# Patient Record
Sex: Female | Born: 1950 | Race: White | Hispanic: No | Marital: Married | Smoking: Current every day smoker
Health system: Southern US, Community
[De-identification: ages and names within clinical notes are randomized; demographics above are authoritative.]

## PROBLEM LIST (undated history)

## (undated) DIAGNOSIS — F419 Anxiety disorder, unspecified: Secondary | ICD-10-CM

## (undated) DIAGNOSIS — Z9011 Acquired absence of right breast and nipple: Secondary | ICD-10-CM

## (undated) DIAGNOSIS — C50919 Malignant neoplasm of unspecified site of unspecified female breast: Secondary | ICD-10-CM

## (undated) HISTORY — PX: BREAST LUMPECTOMY: SHX2

## (undated) HISTORY — PX: MASTECTOMY: SHX3

---

## 2007-08-28 ENCOUNTER — Emergency Department: Payer: Self-pay | Admitting: Emergency Medicine

## 2008-05-10 ENCOUNTER — Ambulatory Visit: Payer: Self-pay | Admitting: Gastroenterology

## 2008-07-14 IMAGING — CR DG CHEST 2V
1 series · 2 of 2 positions shown · non-contrast
Comparison: none

REASON FOR EXAM: cough; R ribs sore
COMMENTS:

[Series 1: view not recorded · 0.17mm/px · 2 of 2 slices shown]
[im 1/2]
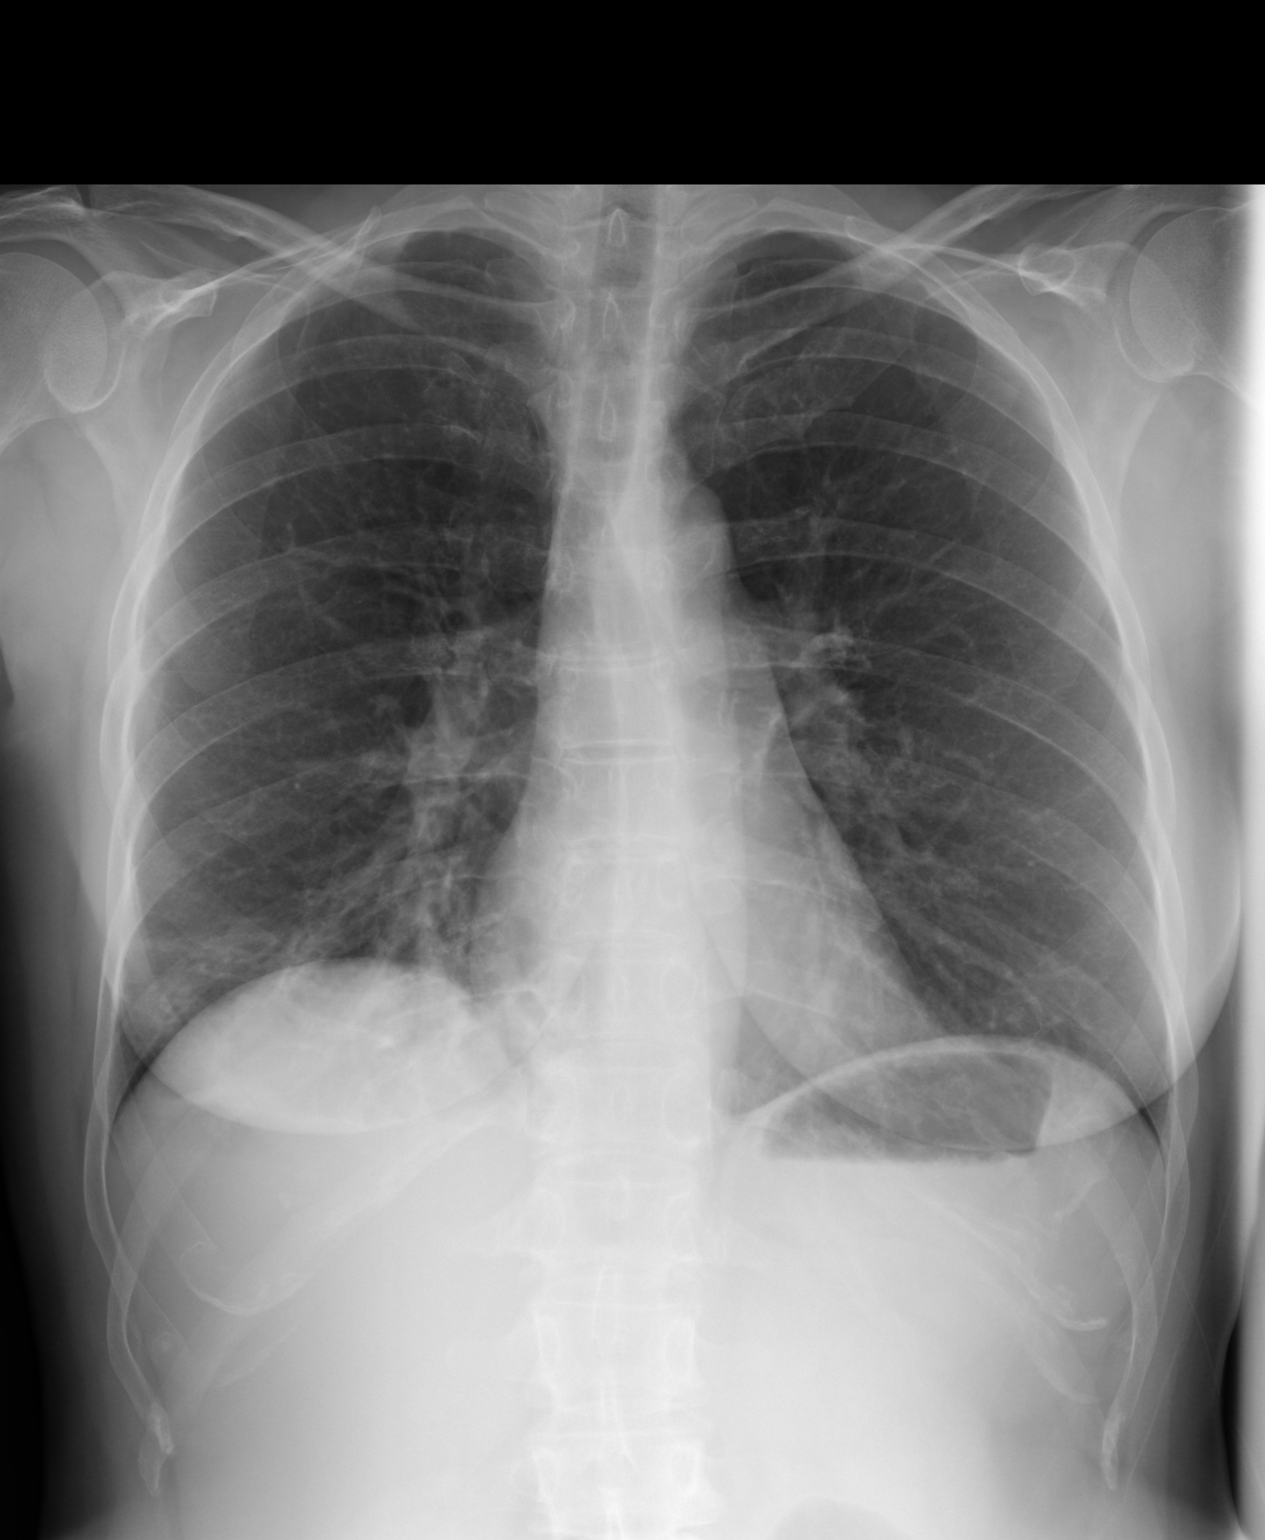
[im 2/2]
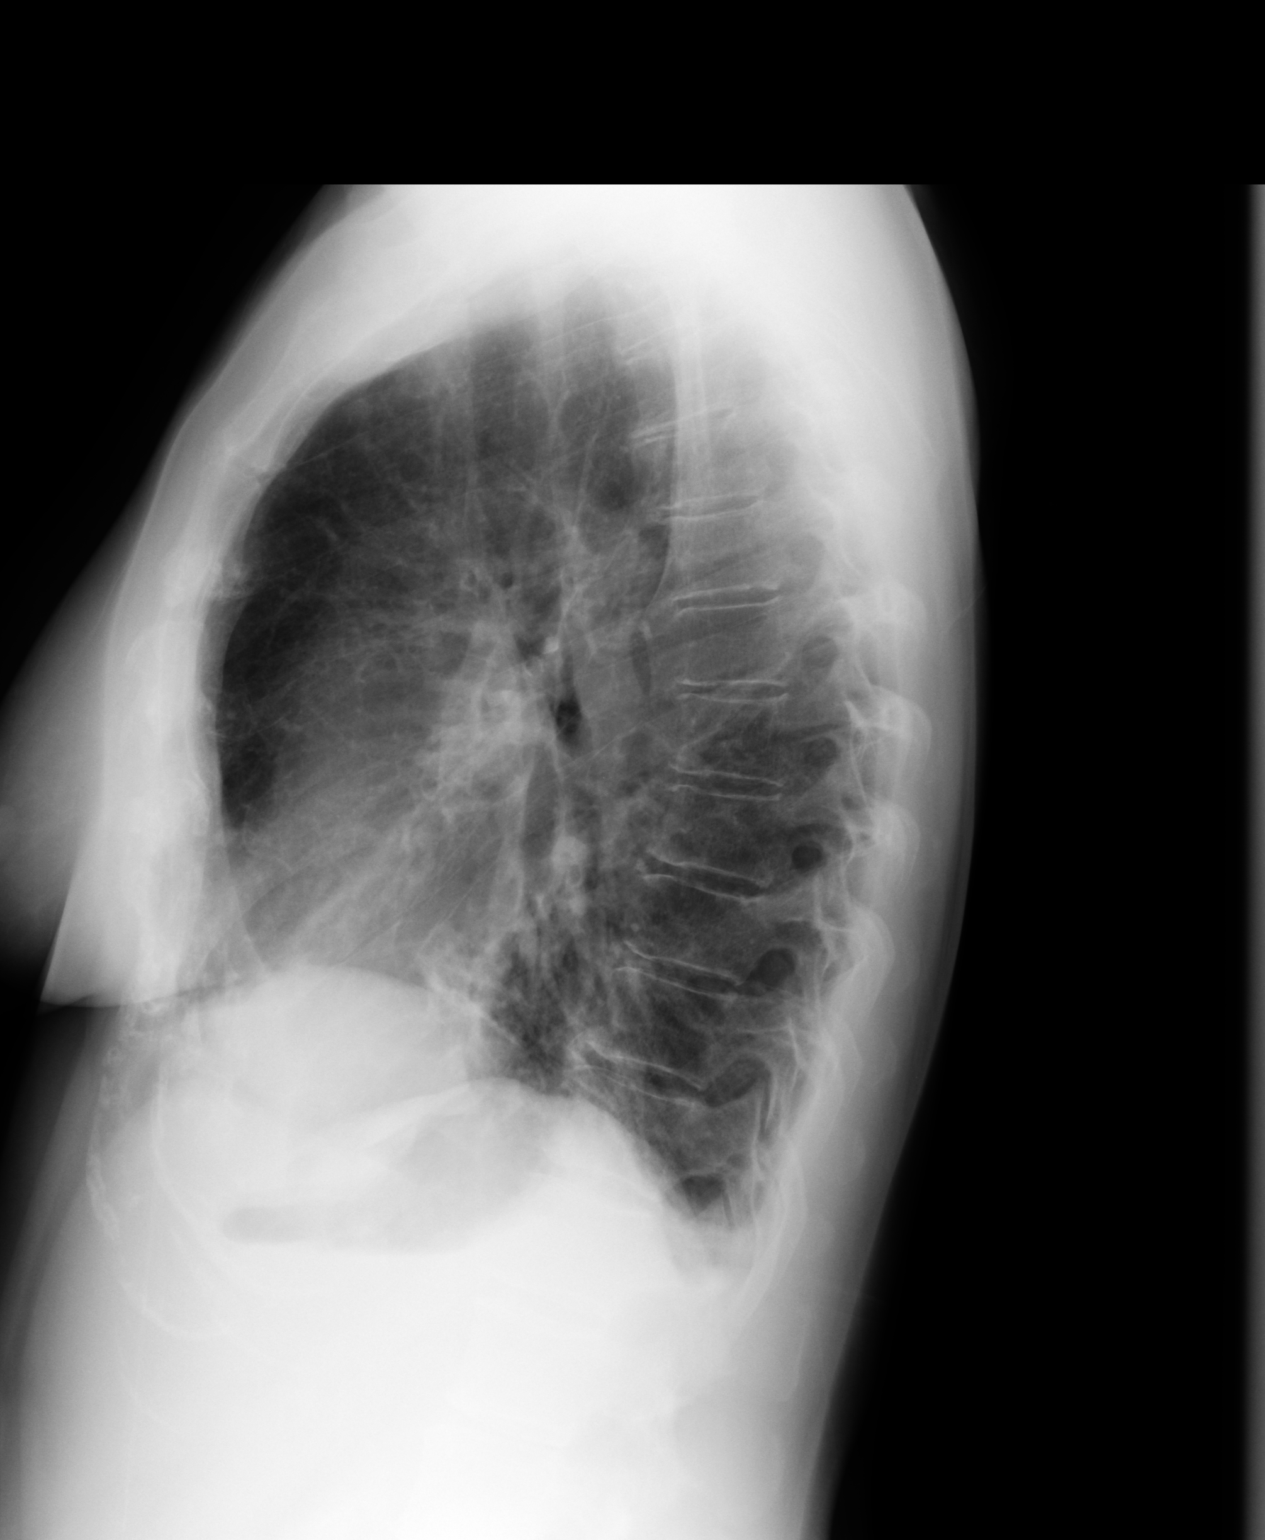

[2 of 2 positions shown; findings below may reference images not displayed]

PROCEDURE:     DXR - DXR CHEST PA (OR AP) AND LATERAL  - August 28, 2007  [DATE]

RESULT:     The lungs are adequately inflated. There is increased density in
the RIGHT lower lobe anterolaterally. There is a trace of blunting of the
RIGHT posterior costophrenic gutter. The LEFT lung is clear. The heart and
pulmonary vascularity are within the limits of normal.
IMPRESSION: There is an infiltrate in the RIGHT lower lobe consistent
with pneumonia. Follow-up films following therapy would be of value to
assure complete clearing.

## 2013-06-09 ENCOUNTER — Ambulatory Visit: Payer: Self-pay | Admitting: Gastroenterology

## 2014-10-03 ENCOUNTER — Ambulatory Visit: Payer: Self-pay | Admitting: Family Medicine

## 2017-08-24 ENCOUNTER — Other Ambulatory Visit: Payer: Self-pay

## 2017-08-24 ENCOUNTER — Ambulatory Visit
Admission: EM | Admit: 2017-08-24 | Discharge: 2017-08-24 | Disposition: A | Discharge status: Home / Self Care | Payer: Medicare Other | Attending: Family Medicine | Admitting: Family Medicine

## 2017-08-24 DIAGNOSIS — F419 Anxiety disorder, unspecified: Secondary | ICD-10-CM | POA: Diagnosis not present

## 2017-08-24 HISTORY — DX: Acquired absence of right breast and nipple: Z90.11

## 2017-08-24 HISTORY — DX: Malignant neoplasm of unspecified site of unspecified female breast: C50.919

## 2017-08-24 HISTORY — DX: Anxiety disorder, unspecified: F41.9

## 2017-08-24 MED ORDER — LORAZEPAM 0.5 MG PO TABS: ORAL_TABLET | ORAL | 0 refills | Status: AC

## 2017-08-24 MED ORDER — DULOXETINE HCL 30 MG PO CPEP: 30.0000 mg | ORAL_CAPSULE | Freq: Every day | ORAL | 0 refills | Status: AC

## 2017-08-24 NOTE — ED Triage Notes (Addendum)
Pt with long history of anxiety and feels like it has been worse the past 5 days. Feels like she has had palpitations and starts to sweat worse in the mornings. States she has been trying to get ahold of her therapist and has not gotten a call back.

## 2017-08-24 NOTE — ED Provider Notes (Signed)
[facility]    CSN: [id number] Arrival date & time: 08/24/17  0847     History   Chief Complaint Chief Complaint  Patient presents with  . Anxiety    HPI Pamela Acevedo is a 67 y.o. female.   67 yo female with a h/o chronic anxiety presents with a c/o worsening anxiety over the last 5 days. States her duloxetine was recently increased from 30mg  to 60mg  and patient unsure if having side effects of increased anxiety, insomnia. States she took 30mg  without any problems. Also states has had increased stressors due to her husband's illnesses. Patient denies any suicidal or homicidal ideation.    The history is provided by the patient.  Anxiety     Past Medical History:  Diagnosis Date  . Anxiety   . Breast cancer ([place])   . History of right mastectomy     There are no active problems to display for this patient.   Past Surgical History:  Procedure Laterality Date  . BREAST LUMPECTOMY    . MASTECTOMY      OB History    No data available       Home Medications    Prior to Admission medications   Medication Sig Start Date End Date Taking? Authorizing Provider  celecoxib (CELEBREX) 200 MG capsule Take 200 mg by mouth 2 (two) times daily.   Yes [provider]  Desvenlafaxine ER (PRISTIQ) 50 MG TB24 Take by mouth daily.   Yes [provider]  letrozole (FEMARA) 2.5 MG tablet Take 2.5 mg by mouth daily.   Yes [provider]  traZODone (DESYREL) 50 MG tablet Take 50 mg by mouth at bedtime.   Yes [provider]  DULoxetine (CYMBALTA) 30 MG capsule Take 1 capsule (30 mg total) by mouth daily. 08/24/17   Norval Gable, MD  LORazepam (ATIVAN) 0.5 MG tablet 1-2 tabs po qhs prn 08/24/17   Norval Gable, MD    Family History Family History  Problem Relation Age of Onset  . Depression Mother     Social History Social History   Tobacco Use  . Smoking status: Current Every Day Smoker    Packs/day: 0.25    Types:  Cigarettes  . Smokeless tobacco: Never Used  Substance Use Topics  . Alcohol use: No    Frequency: Never  . Drug use: No     Allergies   Codeine   Review of Systems Review of Systems   Physical Exam Triage Vital Signs ED Triage Vitals  Enc Vitals Group     BP 08/24/17 0912 110/82     Pulse Rate 08/24/17 0912 87     Resp 08/24/17 0912 16     Temp 08/24/17 0912 98.2 F (36.8 C)     Temp Source 08/24/17 0912 Oral     SpO2 08/24/17 0912 99 %     Weight 08/24/17 0907 140 lb (63.5 kg)     Height 08/24/17 0907 5\' 4"  (1.626 m)     Head Circumference --      Peak Flow --      Pain Score 08/24/17 0940 1     Pain Loc --      Pain Edu? --      Excl. in [place]? --    No data found.  Updated Vital Signs BP 110/82 (BP Location: Left Arm)   Pulse 87   Temp 98.2 F (36.8 C) (Oral)   Resp 16   Ht 5\' 4"  (1.626  m)   Wt 140 lb (63.5 kg)   SpO2 99%   BMI 24.03 kg/m   Visual Acuity Right Eye Distance:   Left Eye Distance:   Bilateral Distance:    Right Eye Near:   Left Eye Near:    Bilateral Near:     Physical Exam  Constitutional: She appears well-developed and well-nourished. No distress.  Cardiovascular: Normal rate, regular rhythm and normal heart sounds.  Pulmonary/Chest: Effort normal and breath sounds normal. No stridor. No respiratory distress. She has no wheezes.  Skin: She is not diaphoretic.  Psychiatric: Her speech is normal and behavior is normal. Judgment and thought content normal. Her mood appears anxious. She is not actively hallucinating. Cognition and memory are normal. She exhibits a depressed mood. She is attentive.  Nursing note and vitals reviewed.    UC Treatments / Results  Labs (all labs ordered are listed, but only abnormal results are displayed) Labs Reviewed - No data to display  EKG  EKG Interpretation None       Radiology No results found.  Procedures Procedures (including critical care time)  Medications Ordered in  UC Medications - No data to display   Initial Impression / Assessment and Plan / UC Course  I have reviewed the triage vital signs and the nursing notes.  Pertinent labs & imaging results that were available during my care of the patient were reviewed by me and considered in my medical decision making (see chart for details).       Final Clinical Impressions(s) / UC Diagnoses   Final diagnoses:  Anxiety    ED Discharge Orders        Ordered    DULoxetine (CYMBALTA) 30 MG capsule  Daily     08/24/17 0936    LORazepam (ATIVAN) 0.5 MG tablet     08/24/17 0936     1. diagnosis reviewed with patient 2. rx as per orders above; reviewed possible side effects, interactions, risks and benefits; decrease duloxetine to 30mg  for a few days, while patient contacts her mental health providers  3. Recommend patient contact her psychiatrist and therapist this week for follow up and further management 4. Follow-up prn if symptoms worsen or don't improve Controlled Substance Prescriptions [place] Controlled Substance Registry consulted? Not Applicable   Norval Gable, MD 08/24/17 [phone]
